# Patient Record
Sex: Male | Born: 1975 | Race: White | Hispanic: No | Smoking: Former smoker
Health system: Southern US, Community
[De-identification: ages and names within clinical notes are randomized; demographics above are authoritative.]

## PROBLEM LIST (undated history)

## (undated) DIAGNOSIS — T148XXA Other injury of unspecified body region, initial encounter: Secondary | ICD-10-CM

## (undated) DIAGNOSIS — F32A Depression, unspecified: Secondary | ICD-10-CM

## (undated) DIAGNOSIS — I420 Dilated cardiomyopathy: Secondary | ICD-10-CM

## (undated) DIAGNOSIS — I509 Heart failure, unspecified: Secondary | ICD-10-CM

## (undated) DIAGNOSIS — Z85820 Personal history of malignant melanoma of skin: Secondary | ICD-10-CM

## (undated) DIAGNOSIS — Z95810 Presence of automatic (implantable) cardiac defibrillator: Secondary | ICD-10-CM

## (undated) DIAGNOSIS — I209 Angina pectoris, unspecified: Secondary | ICD-10-CM

## (undated) DIAGNOSIS — F329 Major depressive disorder, single episode, unspecified: Secondary | ICD-10-CM

## (undated) DIAGNOSIS — F1011 Alcohol abuse, in remission: Secondary | ICD-10-CM

## (undated) DIAGNOSIS — Z8674 Personal history of sudden cardiac arrest: Secondary | ICD-10-CM

## (undated) DIAGNOSIS — S5291XA Unspecified fracture of right forearm, initial encounter for closed fracture: Secondary | ICD-10-CM

## (undated) HISTORY — PX: CARDIAC DEFIBRILLATOR PLACEMENT: SHX171

## (undated) HISTORY — PX: TRANSTHORACIC ECHOCARDIOGRAM: SHX275

## (undated) HISTORY — PX: CARDIAC CATHETERIZATION: SHX172

## (undated) HISTORY — PX: WISDOM TOOTH EXTRACTION: SHX21

## (undated) HISTORY — PX: MELANOMA EXCISION WITH SENTINEL LYMPH NODE BIOPSY: SHX5267

---

## 2008-01-27 DIAGNOSIS — Z85820 Personal history of malignant melanoma of skin: Secondary | ICD-10-CM

## 2008-01-27 HISTORY — DX: Personal history of malignant melanoma of skin: Z85.820

## 2012-07-07 ENCOUNTER — Emergency Department (HOSPITAL_COMMUNITY)
Admission: EM | Admit: 2012-07-07 | Discharge: 2012-07-07 | Disposition: A | Discharge status: Home / Self Care | Payer: Self-pay | Attending: Emergency Medicine | Admitting: Emergency Medicine

## 2012-07-07 ENCOUNTER — Encounter (HOSPITAL_COMMUNITY): Payer: Self-pay | Admitting: Emergency Medicine

## 2012-07-07 ENCOUNTER — Emergency Department (HOSPITAL_COMMUNITY): Payer: Self-pay

## 2012-07-07 DIAGNOSIS — Z85820 Personal history of malignant melanoma of skin: Secondary | ICD-10-CM | POA: Insufficient documentation

## 2012-07-07 DIAGNOSIS — IMO0002 Reserved for concepts with insufficient information to code with codable children: Secondary | ICD-10-CM | POA: Insufficient documentation

## 2012-07-07 DIAGNOSIS — X58XXXA Exposure to other specified factors, initial encounter: Secondary | ICD-10-CM | POA: Insufficient documentation

## 2012-07-07 DIAGNOSIS — S3022XA Contusion of scrotum and testes, initial encounter: Secondary | ICD-10-CM

## 2012-07-07 DIAGNOSIS — Y939 Activity, unspecified: Secondary | ICD-10-CM | POA: Insufficient documentation

## 2012-07-07 DIAGNOSIS — F17200 Nicotine dependence, unspecified, uncomplicated: Secondary | ICD-10-CM | POA: Insufficient documentation

## 2012-07-07 DIAGNOSIS — Y929 Unspecified place or not applicable: Secondary | ICD-10-CM | POA: Insufficient documentation

## 2012-07-07 NOTE — ED Notes (Signed)
Dr. Radford Pax and Dr. Durene Cal at bedside.
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
## 2012-07-07 NOTE — ED Notes (Signed)
Pt returned from radiology.

## 2012-07-07 NOTE — ED Notes (Signed)
MD at bedside. 

## 2012-07-08 NOTE — ED Provider Notes (Signed)
I saw and evaluated the patient, reviewed the resident's note and I agree with the findings and plan.   .Face to face Exam:  General:  Awake HEENT:  Atraumatic Resp:  Normal effort Abd:  Nondistended Neuro:No focal weakness   Ardine Iacovelli L Florice Hindle, MD 07/08/12 1048 

## 2013-11-22 ENCOUNTER — Encounter (HOSPITAL_COMMUNITY): Payer: Self-pay | Admitting: Emergency Medicine

## 2013-11-22 ENCOUNTER — Emergency Department (HOSPITAL_COMMUNITY): Payer: Self-pay

## 2013-11-22 ENCOUNTER — Emergency Department (HOSPITAL_COMMUNITY)
Admission: EM | Admit: 2013-11-22 | Discharge: 2013-11-22 | Disposition: A | Discharge status: Home / Self Care | Payer: Self-pay | Attending: Emergency Medicine | Admitting: Emergency Medicine

## 2013-11-22 DIAGNOSIS — J209 Acute bronchitis, unspecified: Secondary | ICD-10-CM | POA: Insufficient documentation

## 2013-11-22 DIAGNOSIS — Z720 Tobacco use: Secondary | ICD-10-CM | POA: Insufficient documentation

## 2013-11-22 DIAGNOSIS — Z85820 Personal history of malignant melanoma of skin: Secondary | ICD-10-CM | POA: Insufficient documentation

## 2013-11-22 DIAGNOSIS — Z79899 Other long term (current) drug therapy: Secondary | ICD-10-CM | POA: Insufficient documentation

## 2013-11-22 DIAGNOSIS — I447 Left bundle-branch block, unspecified: Secondary | ICD-10-CM | POA: Insufficient documentation

## 2013-11-22 DIAGNOSIS — R079 Chest pain, unspecified: Secondary | ICD-10-CM

## 2013-11-22 LAB — BASIC METABOLIC PANEL
Anion gap: 14 (ref 5–15)
BUN: 12 mg/dL (ref 6–23)
CALCIUM: 9.4 mg/dL (ref 8.4–10.5)
CO2: 25 meq/L (ref 19–32)
Chloride: 102 mEq/L (ref 96–112)
Creatinine, Ser: 0.91 mg/dL (ref 0.50–1.35)
GFR calc Af Amer: >90 mL/min (ref >90)
GFR calc non Af Amer: >90 mL/min (ref >90)
GLUCOSE: 90 mg/dL (ref 70–99)
Potassium: 4 mEq/L (ref 3.7–5.3)
SODIUM: 141 meq/L (ref 137–147)

## 2013-11-22 LAB — CBC WITH DIFFERENTIAL/PLATELET
Basophils Absolute: 0.1 10*3/uL (ref 0.0–0.1)
Basophils Relative: 1 % (ref 0–1)
Eosinophils Absolute: 0.2 10*3/uL (ref 0.0–0.7)
Eosinophils Relative: 2 % (ref 0–5)
HCT: 48 % (ref 39.0–52.0)
HEMOGLOBIN: 17.1 g/dL — AB (ref 13.0–17.0)
LYMPHS ABS: 2.8 10*3/uL (ref 0.7–4.0)
Lymphocytes Relative: 29 % (ref 12–46)
MCH: 33.8 pg (ref 26.0–34.0)
MCHC: 35.6 g/dL (ref 30.0–36.0)
MCV: 94.9 fL (ref 78.0–100.0)
MONOS PCT: 11 % (ref 3–12)
Monocytes Absolute: 1.1 10*3/uL — ABNORMAL HIGH (ref 0.1–1.0)
NEUTROS ABS: 5.6 10*3/uL (ref 1.7–7.7)
NEUTROS PCT: 57 % (ref 43–77)
Platelets: 282 10*3/uL (ref 150–400)
RBC: 5.06 MIL/uL (ref 4.22–5.81)
RDW: 12.3 % (ref 11.5–15.5)
WBC: 9.8 10*3/uL (ref 4.0–10.5)

## 2013-11-22 LAB — I-STAT TROPONIN, ED: TROPONIN I, POC: 0.01 ng/mL (ref 0.00–0.08)

## 2013-11-22 MED ORDER — IPRATROPIUM-ALBUTEROL 0.5-2.5 (3) MG/3ML IN SOLN: 3.0000 mL | RESPIRATORY_TRACT | Status: DC

## 2013-11-22 MED ORDER — ASPIRIN 325 MG PO TABS
325.0000 mg | ORAL_TABLET | Freq: Once | ORAL | Status: AC
  Administered 2013-11-22: 325 mg via ORAL
  Filled 2013-11-22: qty 1

## 2013-11-22 MED ORDER — HYDROCOD POLST-CHLORPHEN POLST 10-8 MG/5ML PO LQCR: 5.0000 mL | Freq: Two times a day (BID) | ORAL | Status: DC

## 2013-11-22 MED ORDER — IPRATROPIUM-ALBUTEROL 0.5-2.5 (3) MG/3ML IN SOLN
3.0000 mL | Freq: Once | RESPIRATORY_TRACT | Status: AC
  Administered 2013-11-22: 3 mL via RESPIRATORY_TRACT
  Filled 2013-11-22: qty 3

## 2013-11-22 MED ORDER — IPRATROPIUM-ALBUTEROL 0.5-2.5 (3) MG/3ML IN SOLN
3.0000 mL | RESPIRATORY_TRACT | Status: DC
  Filled 2013-11-22: qty 3

## 2013-11-22 MED ORDER — ALBUTEROL SULFATE HFA 108 (90 BASE) MCG/ACT IN AERS: 2.0000 | INHALATION_SPRAY | RESPIRATORY_TRACT | Status: DC | PRN

## 2013-11-22 NOTE — ED Notes (Signed)
Pt to xray at this time.

## 2013-11-22 NOTE — ED Provider Notes (Signed)
CSN: [id number]     Arrival date & time 11/22/13  1855 History   First MD Initiated Contact with Patient 11/22/13 2019     Chief Complaint  Patient presents with  . Chest Pain  . Cough     (Consider location/radiation/quality/duration/timing/severity/associated sxs/prior Treatment) HPI The patient reports she's had a cough for approximately a week. He reports it has gotten worse and the cold symptoms a initially had seemed to move down into his chest. He reports that he is coughing up sputum which is green in color. He reports over the past couple of days now he has started to get some tightness feeling in his chest. He reports this worsened by coughing episodes. He denies any prior history of exertional chest pain or exertional dyspnea. He has no history of syncopal episodes.  Family history: The patient reports his father is unknown. Mother has no cardiac history. He has a sister who also has no cardiac history. He has a grandfather who had to get bypass surgery at an older age.  Past Medical History  Diagnosis Date  . Tobacco abuse   . Alcohol abuse     several x per week 4-5 beers  . Melanoma     excised-has derm follow up   Past Surgical History  Procedure Laterality Date  . None     Family History  Problem Relation Age of Onset  . Melanoma      grandfather   History  Substance Use Topics  . Smoking status: Current Every Day Smoker  . Smokeless tobacco: Not on file  . Alcohol Use: Yes    Review of Systems 10 Systems reviewed and are negative for acute change except as noted in the HPI.    Allergies  Review of patient's allergies indicates no known allergies.  Home Medications   Prior to Admission medications   Medication Sig Start Date End Date Taking? Authorizing Provider  pseudoephedrine (SUDAFED) 30 MG tablet Take 30 mg by mouth every 4 (four) hours as needed for congestion.   Yes Historical Provider, MD  albuterol (PROVENTIL HFA;VENTOLIN HFA) 108 (90  BASE) MCG/ACT inhaler Inhale 2 puffs into the lungs every 4 (four) hours as needed for wheezing or shortness of breath. 11/22/13   Arby BarretteMarcy Deni Berti, MD  chlorpheniramine-HYDROcodone (TUSSIONEX PENNKINETIC ER) 10-8 MG/5ML LQCR Take 5 mLs by mouth 2 (two) times daily. 11/22/13   Arby BarretteMarcy Ariabella Brien, MD   BP 124/62  Pulse 79  Temp(Src) 98.5 F (36.9 C) (Oral)  Resp 17  SpO2 95% Physical Exam  Constitutional: He is oriented to person, place, and time. He appears well-developed and well-nourished.  HENT:  Head: Normocephalic and atraumatic.  Nose: Nose normal.  Mouth/Throat: Oropharynx is clear and moist.  Eyes: EOM are normal. Pupils are equal, round, and reactive to light.  Neck: Neck supple.  Cardiovascular: Normal rate, regular rhythm, normal heart sounds and intact distal pulses.   Pulmonary/Chest: Effort normal. No respiratory distress.  Patient has occasional expiratory wheeze. Dry cough with deep inspiration.  Abdominal: Soft. Bowel sounds are normal. He exhibits no distension. There is no tenderness.  Musculoskeletal: Normal range of motion. He exhibits no edema.  Neurological: He is alert and oriented to person, place, and time. He has normal strength. Coordination normal. GCS eye subscore is 4. GCS verbal subscore is 5. GCS motor subscore is 6.  Skin: Skin is warm, dry and intact.  Psychiatric: He has a normal mood and affect.    ED Course  Procedures (including  critical care time) Labs Review Labs Reviewed  CBC WITH DIFFERENTIAL - Abnormal; Notable for the following:    Hemoglobin 17.1 (*)    Monocytes Absolute 1.1 (*)    All other components within normal limits  BASIC METABOLIC PANEL  I-STAT TROPOININ, ED    Imaging Review Dg Chest 2 View  11/22/2013   CLINICAL DATA:  Midsternal chest pain and productive cough.  EXAM: CHEST  2 VIEW  COMPARISON:  None.  FINDINGS: Normal heart size and mediastinal contours. No acute infiltrate or edema. No effusion or pneumothorax. No acute  osseous findings.  IMPRESSION: No active cardiopulmonary disease.   Electronically Signed   By: Tiburcio PeaJonathan  Watts M.D.   On: 11/22/2013 20:15     EKG Interpretation   Date/Time:  Wednesday November 22 2013 19:01:24 EDT Ventricular Rate:  103 PR Interval:  166 QRS Duration: 154 QT Interval:  388 QTC Calculation: 508 R Axis:   15 Text Interpretation:  Sinus tachycardia Possible Left atrial enlargement  Left bundle branch block Abnormal ECG agree. Confirmed by Donnald GarrePfeiffer, MD,  Lebron ConnersMarcy [phone] on 11/22/2013 8:51:21 PM      MDM   Final diagnoses:  Acute bronchitis, unspecified organism  Left bundle branch block   The patient presents with a bronchitis/walking pneumonia type of presentation. Symptoms started with characteristic URI then moved down into his chest with coughing and sputum production. The patient has an abnormal EKG without prior comparison available. The history was not suggestive of cardiac ischemia or dysrhythmia. I did review the EKG and the history with Dr. Christen ButterSivek (cardiology). He reports that the EKG alone without a presentation of ischemia is not an indication for further cardiac workup at this time.    Arby BarretteMarcy Danzel Marszalek, MD 11/22/13 2303

## 2013-11-22 NOTE — Discharge Instructions (Signed)
Acute Bronchitis Bronchitis is inflammation of the airways that extend from the windpipe into the lungs (bronchi). The inflammation often causes mucus to develop. This leads to a cough, which is the most common symptom of bronchitis.  In acute bronchitis, the condition usually develops suddenly and goes away over time, usually in a couple weeks. Smoking, allergies, and asthma can make bronchitis worse. Repeated episodes of bronchitis may cause further lung problems.  CAUSES Acute bronchitis is most often caused by the same virus that causes a cold. The virus can spread from person to person (contagious) through coughing, sneezing, and touching contaminated objects. SIGNS AND SYMPTOMS   Cough.   Fever.   Coughing up mucus.   Body aches.   Chest congestion.   Chills.   Shortness of breath.   Sore throat.  DIAGNOSIS  Acute bronchitis is usually diagnosed through a physical exam. Your health care provider will also ask you questions about your medical history. Tests, such as chest X-rays, are sometimes done to rule out other conditions.  TREATMENT  Acute bronchitis usually goes away in a couple weeks. Oftentimes, no medical treatment is necessary. Medicines are sometimes given for relief of fever or cough. Antibiotic medicines are usually not needed but may be prescribed in certain situations. In some cases, an inhaler may be recommended to help reduce shortness of breath and control the cough. A cool mist vaporizer may also be used to help thin bronchial secretions and make it easier to clear the chest.  HOME CARE INSTRUCTIONS  Get plenty of rest.   Drink enough fluids to keep your urine clear or pale yellow (unless you have a medical condition that requires fluid restriction). Increasing fluids may help thin your respiratory secretions (sputum) and reduce chest congestion, and it will prevent dehydration.   Take medicines only as directed by your health care provider.  If  you were prescribed an antibiotic medicine, finish it all even if you start to feel better.  Avoid smoking and secondhand smoke. Exposure to cigarette smoke or irritating chemicals will make bronchitis worse. If you are a smoker, consider using nicotine gum or skin patches to help control withdrawal symptoms. Quitting smoking will help your lungs heal faster.   Reduce the chances of another bout of acute bronchitis by washing your hands frequently, avoiding people with cold symptoms, and trying not to touch your hands to your mouth, nose, or eyes.   Keep all follow-up visits as directed by your health care provider.  SEEK MEDICAL CARE IF: Your symptoms do not improve after 1 week of treatment.  SEEK IMMEDIATE MEDICAL CARE IF:  You develop an increased fever or chills.   You have chest pain.   You have severe shortness of breath.  You have bloody sputum.   You develop dehydration.  You faint or repeatedly feel like you are going to pass out.  You develop repeated vomiting.  You develop a severe headache. MAKE SURE YOU:   Understand these instructions.  Will watch your condition.  Will get help right away if you are not doing well or get worse. Document Released: 02/20/2004 Document Revised: 05/29/2013 Document Reviewed: 07/05/2012 [facility] Patient Information 2015 [place], [place]. This information is not intended to replace advice given to you by your health care provider. Make sure you discuss any questions you have with your health care provider.  YOU HAVE AN ABNORMAL EKG (HEART TRACING). YOU NEED TO SCHEDULE A RECHECK WITH A FAMILY DOCTOR FOR FURTHER REGULAR MEDICAL CARE. QUIT  SMOKING. IT INCREASES YOUR RISK OF SERIOUS HEART AND LUNG DISEASE.   Emergency Department Resource Guide 1) Find a Doctor and Pay Out of Pocket Although you won't have to find out who is covered by your insurance plan, it is a good idea to ask around and get recommendations. You will then need  to call the office and see if the doctor you have chosen will accept you as a new patient and what types of options they offer for patients who are self-pay. Some doctors offer discounts or will set up payment plans for their patients who do not have insurance, but you will need to ask so you aren't surprised when you get to your appointment.  2) Contact Your Local Health Department Not all health departments have doctors that can see patients for sick visits, but many do, so it is worth a call to see if yours does. If you don't know where your local health department is, you can check in your phone book. The CDC also has a tool to help you locate your state's health department, and many state websites also have listings of all of their local health departments.  3) Find a Walk-in Clinic If your illness is not likely to be very severe or complicated, you may want to try a walk in clinic. These are popping up all over the country in pharmacies, drugstores, and shopping centers. They're usually staffed by nurse practitioners or physician assistants that have been trained to treat common illnesses and complaints. They're usually fairly quick and inexpensive. However, if you have serious medical issues or chronic medical problems, these are probably not your best option.  No Primary Care Doctor: - Call Health Connect at  [phone] - they can help you locate a primary care doctor that  accepts your insurance, provides certain services, etc. - Physician Referral Service- [phone]  Chronic Pain Problems: Organization         Address  Phone   Notes  [facility]  [phone]) [phone] Patients need to be referred by their primary care doctor.   Medication Assistance: Organization         Address  Phone   Notes  [facility] [address] [place], [place] [address] [phone]) [phone] --Must be a resident of [facility] [place] -- Must have NO insurance  coverage whatsoever (no Medicaid/ Medicare, etc.) -- The pt. MUST have a primary care doctor that directs their care regularly and follows them in the community   MedAssist  [phone]   [employer]  [phone]) [phone]    Agencies that provide inexpensive medical care: Organization         Address  Phone   Notes  [facility]  [phone]) [phone]   [facility]    [phone]) [phone]   [facility] [address] [place], [place] [address] [phone]) [phone]   [facility] [address], [place] [phone]) [phone]   Planned Parenthood    [phone]) [phone]   [facility]    [phone]) [phone]   [facility] and [facility]  [address], [place] Phone:  [phone]) [phone], Fax:  [phone] Hours of Operation:  9 am - 6 pm, M-F.  Also accepts Medicaid/Medicare and self-pay.  [facility]  [address], [place] Phone: [phone]) [phone], Fax: [phone]) [phone]. Hours of Operation:  8:30 am -  5:30 pm, M-F.  Also accepts Medicaid and self-pay.  [facility] [place] [address], [place] Phone: [phone]   [facility] [address] Natasha Bence [place], [place] [phone], Ext. [phone] Mondays & Thursdays: 7-9 AM.  First 15 patients are seen on a first come, first serve basis.    Medicaid-accepting [facility] Providers:  Organization         Address  Phone   Notes  [facility] [address], [place] [phone] Also accepts self-pay patients.  [facility] [address] Laurell Josephs [place], [place]  [phone]   [facility] [address], [place] [phone]   [facility] [address] [place] [phone]   Renaye Rakers [address], [place]   [phone] Only accepts [place] patients after they have their  name applied to their card.   Self-Pay (no insurance) in [facility]:  Organization         Address  Phone   Notes  Sickle Cell Patients, [facility] [address] [place], [place] [phone]   [facility] [address] [place], [place] [phone]   [facility]  [address], [place] [phone]   [facility]/Dr. Osei-Bonsu  [address] [place] or [address], [place] [phone] Phone number for both [place] and [place] locations is the same.  [facility] [address] [place] [phone]   [facility] [address] [place] or [address] [phone]   [facility] [address], [place] [phone], phone; [phone], fax Sees patients 1st and 3rd Saturday of every month.  Must not qualify for public or private insurance (i.e. Medicaid, Medicare, [place] Health Choice, Veterans' Benefits)  Household income should be no more than 200% of the poverty level The clinic cannot treat you if you are pregnant or think you are pregnant  Sexually transmitted diseases are not treated at the clinic.    Dental Care: Organization         Address  Phone  Notes  [facility] [place] [facility] [address] [place], [place] [phone] Accepts children up to age 33 who are enrolled in [place] or [place] Health Choice; pregnant women with a Medicaid card; and children who have applied for Medicaid or [place] Health Choice, but were declined, whose parents can pay a reduced fee at time of service.  [facility] of [facility]  [address], [place] [phone] Accepts children up to age 65 who are enrolled in [place] or [place] Health Choice; pregnant women with a Medicaid card; and children who have applied for  Medicaid or [place] Health Choice, but were declined, whose parents can pay a reduced fee at time of service.  [facility]  [address] [place], [place] [phone] Patients are seen by appointment only. Walk-ins are not accepted. [facility] will see patients 62 years of age and older. Monday - Tuesday (8am-5pm) Most Wednesdays (8:30-5pm) $30 per visit, cash only  [facility] Adult Dental Access PROGRAM  [address], [facility] [phone] Patients are seen by appointment only. Walk-ins are  not accepted. [facility] will see patients 70 years of age and older. One Wednesday Evening (Monthly: Volunteer Based).  $30 per visit, cash only  [employer]  [phone] for adults; Children under age 19, call Graduate Pediatric Dentistry at [phone]. Children aged 29-14, please call [phone] to request a pediatric application.  Dental services are provided in all areas of dental care including fillings, crowns and bridges, complete and partial dentures, implants, gum treatment, root canals, and extractions. Preventive care is also provided. Treatment is provided to both adults and children. Patients are selected via a lottery and there is often a waiting list.   [facility] [address] [place]  [phone] [email or web address]   [facility] [address] [place], [place] [phone], Ext. [phone] Second and Fourth Thursday of each month, opens at 6:30 AM; Clinic ends at 9 AM.  Patients are seen on a first-come first-served basis, and a limited number are seen during each clinic.   [facility]  [address] Ether Griffins [place], [place] [phone]   Eligibility Requirements You must have lived in [place], [place], or [place] counties for at least the last three months.   You cannot be eligible for state or federal sponsored [place], including [employer], [place],  or [employer].   You generally cannot be eligible for healthcare insurance through your employer.    How to apply: Eligibility screenings are held every Tuesday and Wednesday afternoon from 1:00 pm until 4:00 pm. You do not need an appointment for the interview!  [facility] [address] [place], [place] [phone]   [facility] Health Department  [phone]   [facility] Health Department  [phone]   [facility] Health Department  [phone]    Behavioral Health Resources in the Community: Intensive Outpatient Programs Organization         Address  Phone  Notes  [facility] Services [address] [place], [place] [phone]   [facility] Outpatient [address] [place], [place] [phone]   [facility] [address], [place], [place]  [phone]   [facility] [address]  [place], [place] [phone] or [phone]   Substance Abuse Resources Organization         Address  Phone  Notes  [facility] Drug Services  [phone]   [facility]  [phone]   [facility] [place]  [phone]   Floydene Flock  [phone]   Residential & Outpatient Substance Abuse Program  [phone]   Psychological Services Organization         Address  Phone  Notes  [facility] Behavioral Health  [phone]   [facility] Services  [phone]   [facility] Mental Health [address] [place] [phone] or [phone]    Mobile Crisis Teams Organization         Address  Phone  Notes  [facility], Mobile Crisis Care Unit  [phone]   [facility]  [address] [place], [place] [phone]   Doristine Locks [address] [place] [phone]    Self-Help/Support Groups Organization         Address  Phone             Notes  [facility] [place] - variety of support groups   [phone]- I7437963 Call for more information  Narcotics Anonymous (NA),  [facility] [address], [place]  2 meetings at this location   Residential Sports administratorTreatment Programs Organization         Address  Phone  Notes  [facility] [address],    [place]  [phone]   [facility]  [address] [place] [phone], [place], [place] [phone]   [facility] [address], [place] [phone] Admissions: 8am-3pm M-F  [facility] [address]    [place], [place] [phone]   [facility] [address], [place], [place] [phone]   [facility] [address]  [place], [place] [phone]   [facility] - Intensive Outpatient [address], [place], [place] [phone]   [facility] ([facility] [address]  [place], [place] [phone] or [phone]   [facility] ([facility]) [address] [place], [place] [phone] Accepts Medicaid  [facility] [address]  [place] [phone] Substance Abuse/Addiction Treatment   [facility] Behavioral Health Resources Organization         Address  Phone  Notes  [facility]  [phone](888) [phone]   Angie FavaJulie Brannon, PhD [address] [place], [place]   [phone](336) [phone] or [phone]) [phone]   [facility]   [address] [place], [place] [phone](336) [phone]   [facility] [address], [place], [place] [phone]) [phone] Insurance/Medicaid/sponsorship through [facility]  [facility] [address]                                    [place], [place] [phone]) [phone] Therapy/tele-psych/case  [facility] [address].   [place], [place] [phone](336) [phone]    Dr. Lolly MustacheArfeen  [phone](336) [phone]   [facility]  United [employer] [facility] 1) [address], [place] 2) [address], [place] 3)  [address], [place] [phone](336) [phone](336) [phone]  [phone]) [phone]   [facility] Child Abuse Hotline [phone](336) [phone] or [phone](336) [phone] (After Hours)

## 2013-11-22 NOTE — ED Notes (Signed)
Pt c/o mid sternal CP with cough and pain with cough; pt sts feels tightness in chest; pt noted to have LBBB no hx of same; EDP aware

## 2015-07-29 IMAGING — CR DG CHEST 2V
2 series · 2 of 2 positions shown · non-contrast
Comparison: None.

CLINICAL DATA: Midsternal chest pain and productive cough.

EXAM:
CHEST  2 VIEW

[w chest pa]
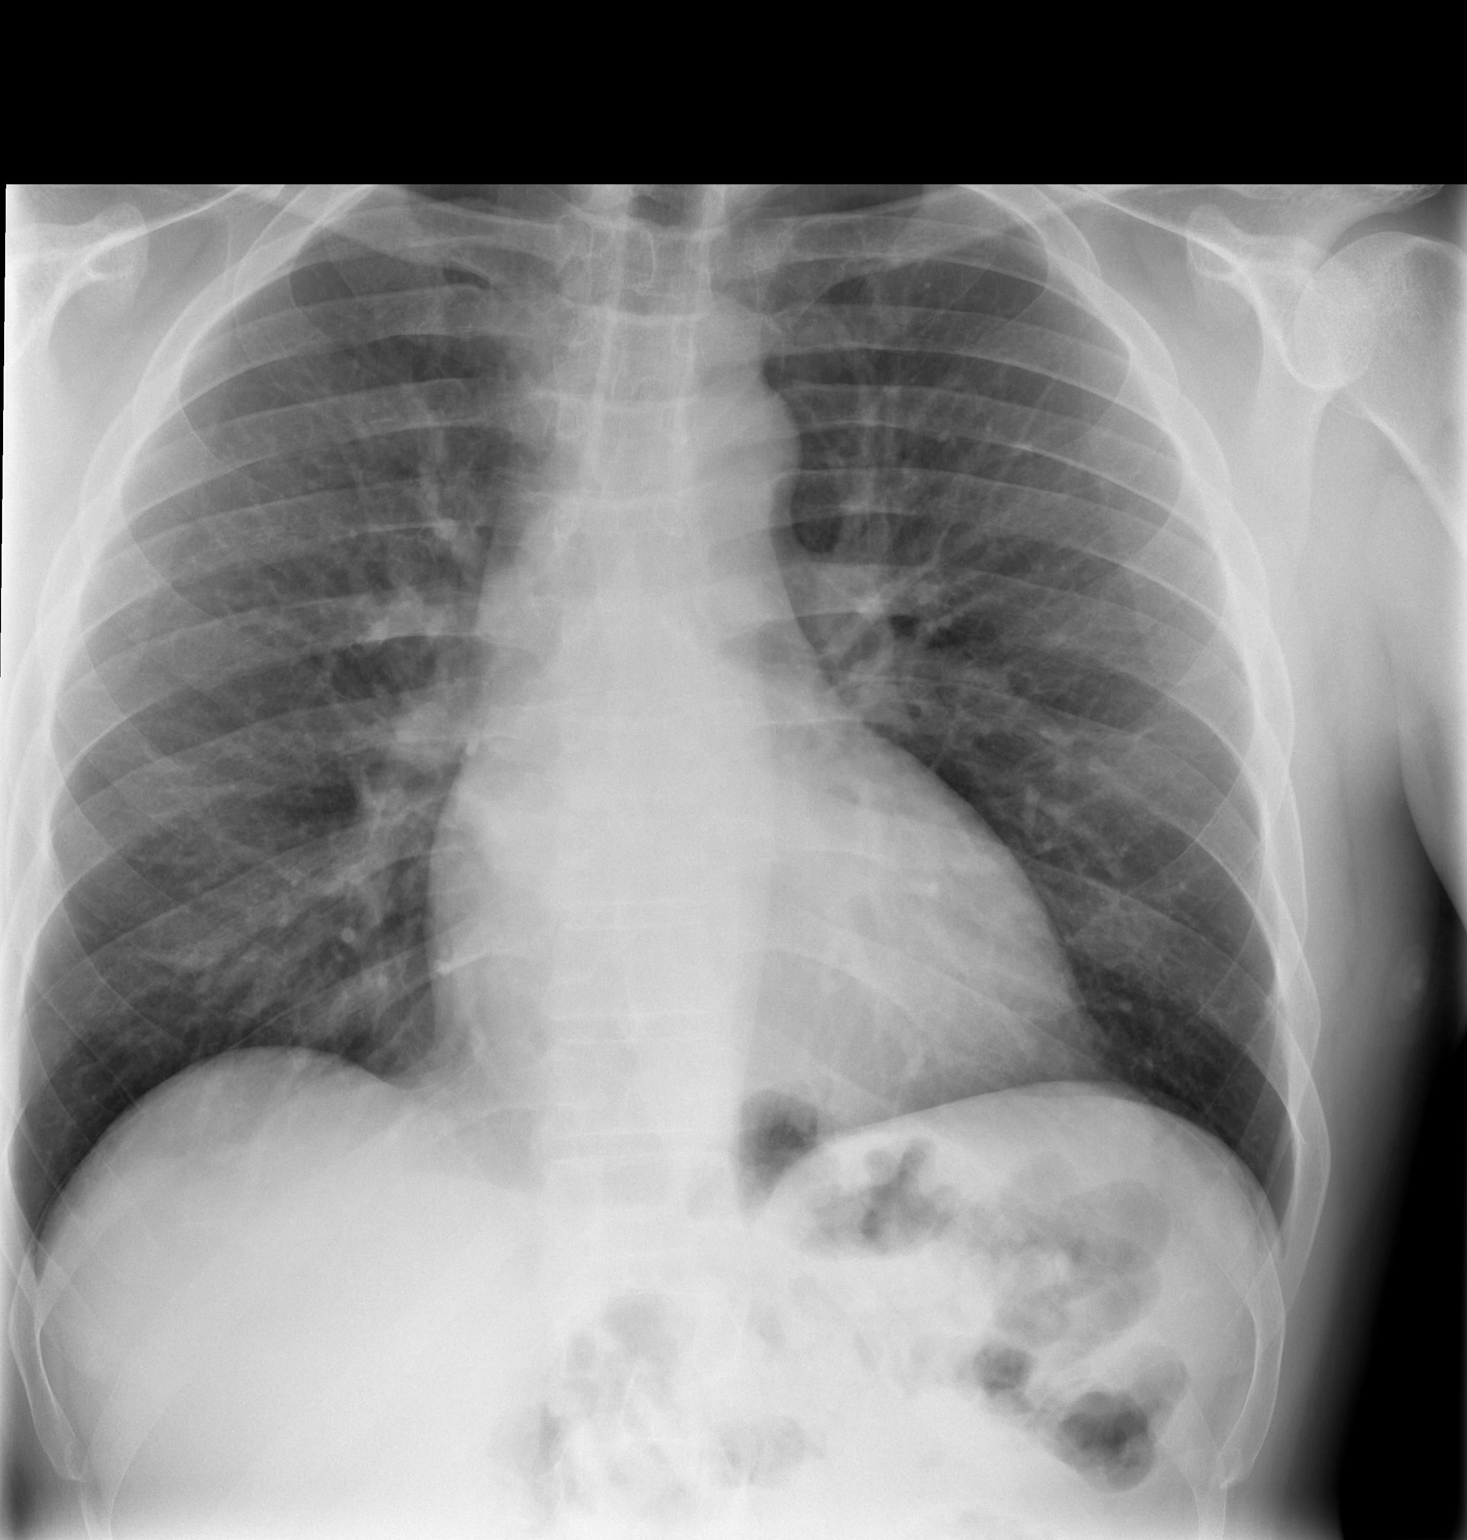

[w chest lat]
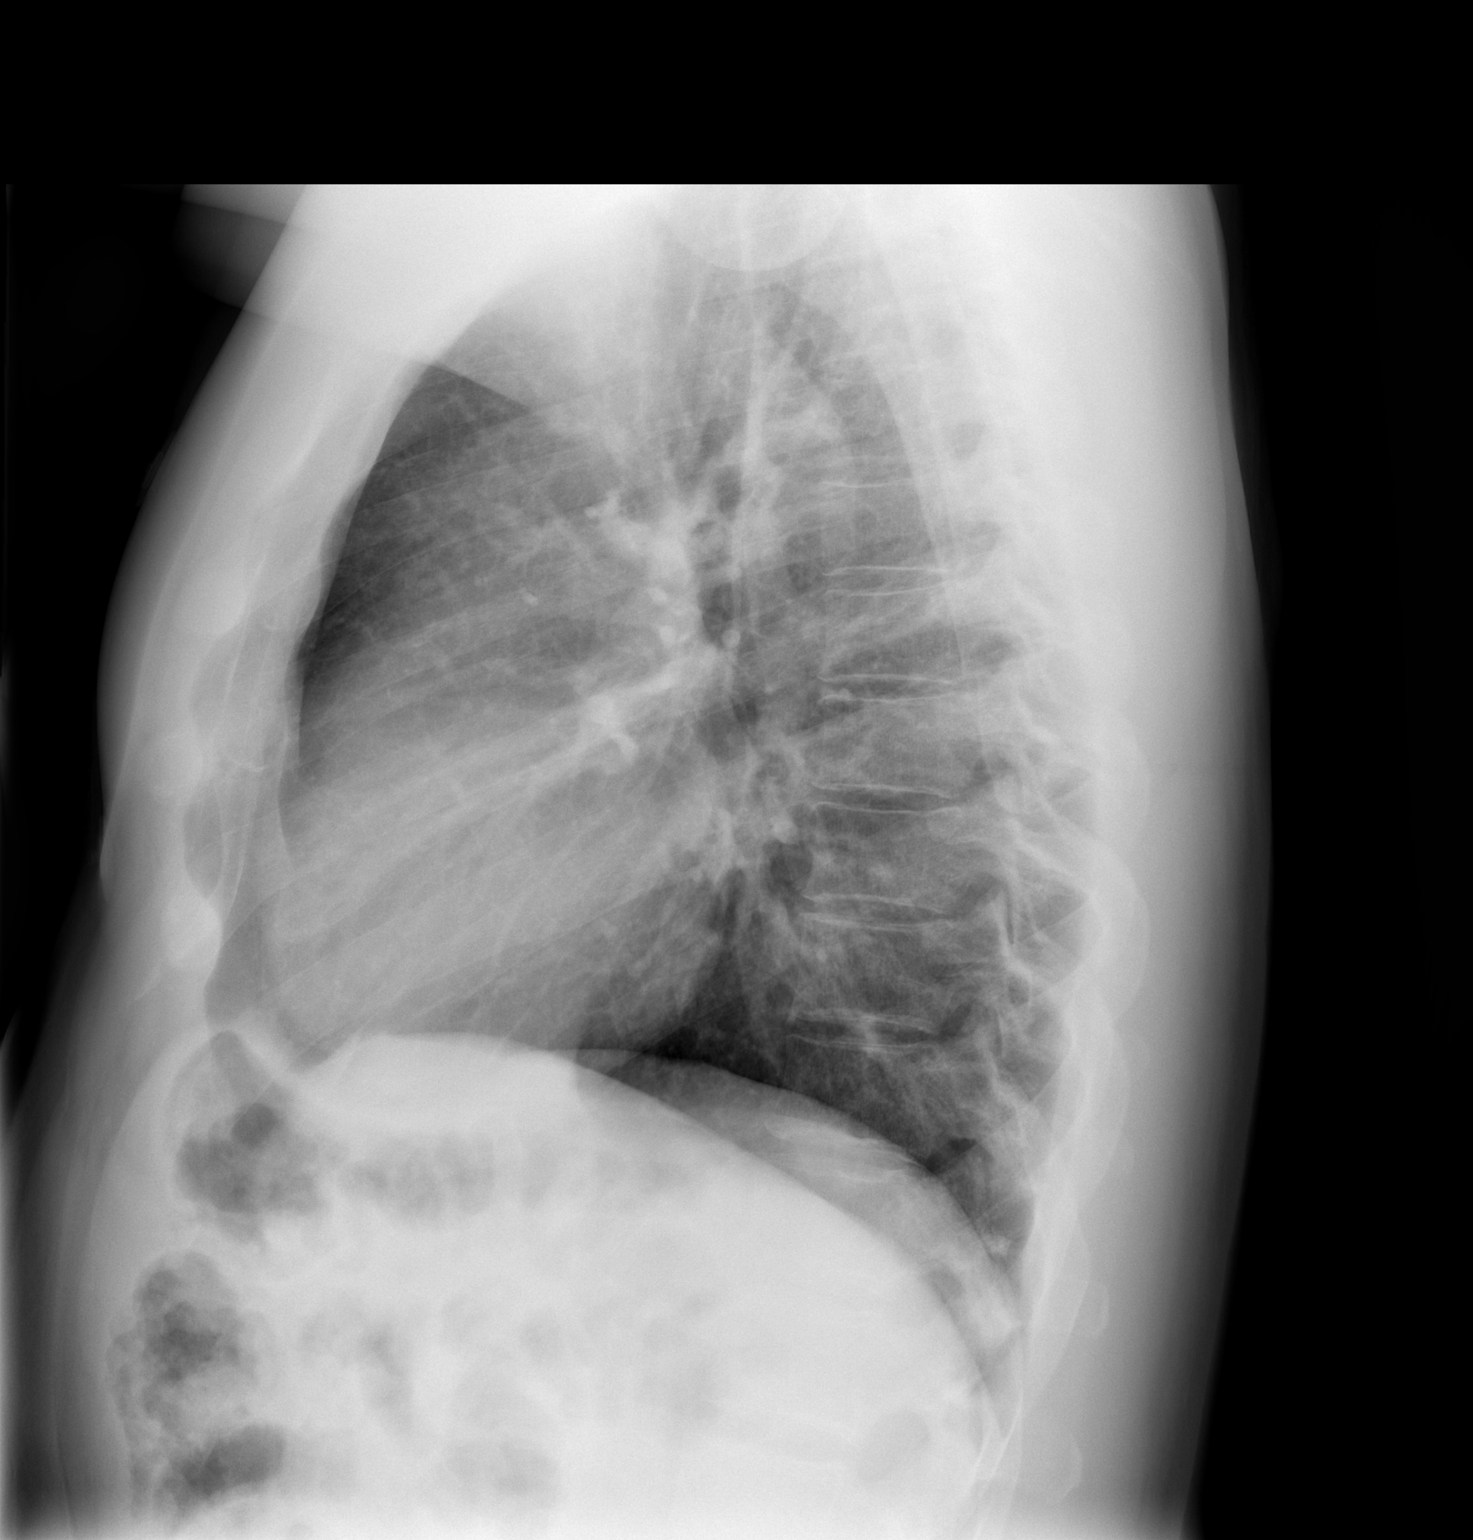

[2 of 2 positions shown; findings below may reference images not displayed]

FINDINGS: Normal heart size and mediastinal contours. No acute infiltrate or
edema. No effusion or pneumothorax. No acute osseous findings.
IMPRESSION: No active cardiopulmonary disease.

## 2015-11-07 DIAGNOSIS — I10 Essential (primary) hypertension: Secondary | ICD-10-CM

## 2015-11-07 DIAGNOSIS — R55 Syncope and collapse: Secondary | ICD-10-CM

## 2015-11-07 DIAGNOSIS — R42 Dizziness and giddiness: Secondary | ICD-10-CM

## 2015-11-07 DIAGNOSIS — R002 Palpitations: Secondary | ICD-10-CM

## 2015-11-07 DIAGNOSIS — F17210 Nicotine dependence, cigarettes, uncomplicated: Secondary | ICD-10-CM

## 2015-11-07 DIAGNOSIS — F1210 Cannabis abuse, uncomplicated: Secondary | ICD-10-CM

## 2015-11-07 DIAGNOSIS — Z899 Acquired absence of limb, unspecified: Secondary | ICD-10-CM

## 2015-11-07 DIAGNOSIS — I447 Left bundle-branch block, unspecified: Secondary | ICD-10-CM

## 2015-11-08 DIAGNOSIS — I10 Essential (primary) hypertension: Secondary | ICD-10-CM | POA: Diagnosis not present

## 2015-11-08 DIAGNOSIS — F1210 Cannabis abuse, uncomplicated: Secondary | ICD-10-CM | POA: Diagnosis not present

## 2015-11-08 DIAGNOSIS — R002 Palpitations: Secondary | ICD-10-CM | POA: Diagnosis not present

## 2015-11-08 DIAGNOSIS — Z899 Acquired absence of limb, unspecified: Secondary | ICD-10-CM | POA: Diagnosis not present

## 2015-11-08 DIAGNOSIS — I447 Left bundle-branch block, unspecified: Secondary | ICD-10-CM | POA: Diagnosis not present

## 2015-11-08 DIAGNOSIS — R55 Syncope and collapse: Secondary | ICD-10-CM | POA: Diagnosis not present

## 2015-11-08 DIAGNOSIS — R42 Dizziness and giddiness: Secondary | ICD-10-CM | POA: Diagnosis not present

## 2015-11-08 DIAGNOSIS — F17210 Nicotine dependence, cigarettes, uncomplicated: Secondary | ICD-10-CM | POA: Diagnosis not present

## 2015-11-09 DIAGNOSIS — R55 Syncope and collapse: Secondary | ICD-10-CM | POA: Diagnosis not present

## 2015-11-09 DIAGNOSIS — R002 Palpitations: Secondary | ICD-10-CM | POA: Diagnosis not present

## 2015-11-09 DIAGNOSIS — I10 Essential (primary) hypertension: Secondary | ICD-10-CM | POA: Diagnosis not present

## 2015-11-09 DIAGNOSIS — I447 Left bundle-branch block, unspecified: Secondary | ICD-10-CM | POA: Diagnosis not present

## 2015-11-10 DIAGNOSIS — I447 Left bundle-branch block, unspecified: Secondary | ICD-10-CM | POA: Diagnosis not present

## 2015-11-10 DIAGNOSIS — I10 Essential (primary) hypertension: Secondary | ICD-10-CM | POA: Diagnosis not present

## 2015-11-10 DIAGNOSIS — R55 Syncope and collapse: Secondary | ICD-10-CM | POA: Diagnosis not present

## 2015-11-10 DIAGNOSIS — R002 Palpitations: Secondary | ICD-10-CM | POA: Diagnosis not present

## 2015-11-11 DIAGNOSIS — I447 Left bundle-branch block, unspecified: Secondary | ICD-10-CM | POA: Diagnosis not present

## 2015-11-11 DIAGNOSIS — R002 Palpitations: Secondary | ICD-10-CM | POA: Diagnosis not present

## 2015-11-11 DIAGNOSIS — I10 Essential (primary) hypertension: Secondary | ICD-10-CM | POA: Diagnosis not present

## 2015-11-11 DIAGNOSIS — R55 Syncope and collapse: Secondary | ICD-10-CM | POA: Diagnosis not present

## 2015-12-27 DIAGNOSIS — Z8674 Personal history of sudden cardiac arrest: Secondary | ICD-10-CM

## 2015-12-27 HISTORY — DX: Personal history of sudden cardiac arrest: Z86.74

## 2017-08-26 ENCOUNTER — Encounter (HOSPITAL_BASED_OUTPATIENT_CLINIC_OR_DEPARTMENT_OTHER): Payer: Self-pay | Admitting: *Deleted

## 2017-08-26 ENCOUNTER — Other Ambulatory Visit: Payer: Self-pay | Admitting: Orthopedic Surgery

## 2017-08-26 ENCOUNTER — Other Ambulatory Visit: Payer: Self-pay

## 2017-08-26 NOTE — Progress Notes (Addendum)
Spoke w/ pt via phone for pre-op interview.   Npo after mn w/ exception clear liquids until 0615 (no cream/ milk products).   Arrive at 1015.  Needs istat.  Requested lov note, ekg, icd device check, and last 2 echos from pt cardiologist-,Dr Sandy Salaamali akbary ([facility] [place]), to received via fax.  Also requested cardiac cath reports , icd placement operative record, and discharge summary via fax to [facility] [place] [facility].  Per pt he know his ef was 18% when icd placed and per last echo in June 2019 ef 55%.  Denies cardiac s&s.    ADDENDUM:  Received via fax from dr Sandy Salaamali akbary office :  lov note dated 07-27-2017 which has device check results, echo from 07/ 2018 and 06-10-2017, and last ekg (which is current).  Updated history in epic. Placed fax information with chart.  Also faxed device orders to dr Sandy Salaamali akbary office.

## 2017-08-27 ENCOUNTER — Ambulatory Visit (HOSPITAL_BASED_OUTPATIENT_CLINIC_OR_DEPARTMENT_OTHER): Payer: Worker's Compensation | Admitting: Anesthesiology

## 2017-08-27 ENCOUNTER — Ambulatory Visit (HOSPITAL_BASED_OUTPATIENT_CLINIC_OR_DEPARTMENT_OTHER)
Admission: RE | Admit: 2017-08-27 | Discharge: 2017-08-27 | Disposition: A | Discharge status: Home / Self Care | Payer: Worker's Compensation | Source: Ambulatory Visit | Attending: Orthopedic Surgery | Admitting: Orthopedic Surgery

## 2017-08-27 ENCOUNTER — Encounter (HOSPITAL_BASED_OUTPATIENT_CLINIC_OR_DEPARTMENT_OTHER): Payer: Self-pay | Admitting: Anesthesiology

## 2017-08-27 ENCOUNTER — Encounter (HOSPITAL_BASED_OUTPATIENT_CLINIC_OR_DEPARTMENT_OTHER)
Admission: RE | Disposition: A | Discharge status: Home / Self Care | Payer: Self-pay | Source: Ambulatory Visit | Attending: Orthopedic Surgery

## 2017-08-27 DIAGNOSIS — W11XXXA Fall on and from ladder, initial encounter: Secondary | ICD-10-CM | POA: Diagnosis not present

## 2017-08-27 DIAGNOSIS — Z79899 Other long term (current) drug therapy: Secondary | ICD-10-CM | POA: Insufficient documentation

## 2017-08-27 DIAGNOSIS — Z95810 Presence of automatic (implantable) cardiac defibrillator: Secondary | ICD-10-CM | POA: Insufficient documentation

## 2017-08-27 DIAGNOSIS — I420 Dilated cardiomyopathy: Secondary | ICD-10-CM | POA: Diagnosis not present

## 2017-08-27 DIAGNOSIS — I509 Heart failure, unspecified: Secondary | ICD-10-CM | POA: Insufficient documentation

## 2017-08-27 DIAGNOSIS — Z8674 Personal history of sudden cardiac arrest: Secondary | ICD-10-CM | POA: Insufficient documentation

## 2017-08-27 DIAGNOSIS — Z85820 Personal history of malignant melanoma of skin: Secondary | ICD-10-CM | POA: Insufficient documentation

## 2017-08-27 DIAGNOSIS — I110 Hypertensive heart disease with heart failure: Secondary | ICD-10-CM | POA: Diagnosis not present

## 2017-08-27 DIAGNOSIS — Z7982 Long term (current) use of aspirin: Secondary | ICD-10-CM | POA: Diagnosis not present

## 2017-08-27 DIAGNOSIS — Z808 Family history of malignant neoplasm of other organs or systems: Secondary | ICD-10-CM | POA: Diagnosis not present

## 2017-08-27 DIAGNOSIS — Z87891 Personal history of nicotine dependence: Secondary | ICD-10-CM | POA: Diagnosis not present

## 2017-08-27 DIAGNOSIS — S52571A Other intraarticular fracture of lower end of right radius, initial encounter for closed fracture: Secondary | ICD-10-CM | POA: Diagnosis present

## 2017-08-27 HISTORY — DX: Presence of automatic (implantable) cardiac defibrillator: Z95.810

## 2017-08-27 HISTORY — DX: Major depressive disorder, single episode, unspecified: F32.9

## 2017-08-27 HISTORY — DX: Other injury of unspecified body region, initial encounter: T14.8XXA

## 2017-08-27 HISTORY — DX: Personal history of sudden cardiac arrest: Z86.74

## 2017-08-27 HISTORY — DX: Dilated cardiomyopathy: I42.0

## 2017-08-27 HISTORY — DX: Heart failure, unspecified: I50.9

## 2017-08-27 HISTORY — DX: Depression, unspecified: F32.A

## 2017-08-27 HISTORY — DX: Unspecified fracture of right forearm, initial encounter for closed fracture: S52.91XA

## 2017-08-27 HISTORY — PX: OPEN REDUCTION INTERNAL FIXATION (ORIF) DISTAL RADIAL FRACTURE: SHX5989

## 2017-08-27 HISTORY — DX: Angina pectoris, unspecified: I20.9

## 2017-08-27 HISTORY — DX: Personal history of malignant melanoma of skin: Z85.820

## 2017-08-27 HISTORY — DX: Alcohol abuse, in remission: F10.11

## 2017-08-27 LAB — POCT I-STAT 4, (NA,K, GLUC, HGB,HCT)
Glucose, Bld: 110 mg/dL — ABNORMAL HIGH (ref 70–99)
HCT: 37 % — ABNORMAL LOW (ref 39.0–52.0)
Hemoglobin: 12.6 g/dL — ABNORMAL LOW (ref 13.0–17.0)
Potassium: 4.1 mmol/L (ref 3.5–5.1)
Sodium: 140 mmol/L (ref 135–145)

## 2017-08-27 SURGERY — OPEN REDUCTION INTERNAL FIXATION (ORIF) DISTAL RADIUS FRACTURE
Anesthesia: General | Laterality: Right

## 2017-08-27 MED ORDER — OXYCODONE HCL 5 MG PO TABS
5.0000 mg | ORAL_TABLET | Freq: Once | ORAL | Status: DC | PRN
  Filled 2017-08-27: qty 1

## 2017-08-27 MED ORDER — OXYCODONE HCL 5 MG/5ML PO SOLN
5.0000 mg | Freq: Once | ORAL | Status: DC | PRN
  Filled 2017-08-27: qty 5

## 2017-08-27 MED ORDER — MIDAZOLAM HCL 2 MG/2ML IJ SOLN
INTRAMUSCULAR | Status: AC
  Filled 2017-08-27: qty 2

## 2017-08-27 MED ORDER — LIDOCAINE 2% (20 MG/ML) 5 ML SYRINGE
INTRAMUSCULAR | Status: DC | PRN
  Administered 2017-08-27: 100 mg via INTRAVENOUS

## 2017-08-27 MED ORDER — FENTANYL CITRATE (PF) 100 MCG/2ML IJ SOLN
INTRAMUSCULAR | Status: AC
  Filled 2017-08-27: qty 2

## 2017-08-27 MED ORDER — PROMETHAZINE HCL 25 MG/ML IJ SOLN
6.2500 mg | INTRAMUSCULAR | Status: DC | PRN
Start: 2017-08-27 — End: 2017-08-27
  Filled 2017-08-27: qty 1

## 2017-08-27 MED ORDER — CEFAZOLIN SODIUM-DEXTROSE 2-4 GM/100ML-% IV SOLN
2.0000 g | INTRAVENOUS | Status: AC
  Administered 2017-08-27: 2 g via INTRAVENOUS
  Filled 2017-08-27: qty 100

## 2017-08-27 MED ORDER — ONDANSETRON HCL 4 MG/2ML IJ SOLN
INTRAMUSCULAR | Status: AC
  Filled 2017-08-27: qty 2

## 2017-08-27 MED ORDER — MIDAZOLAM HCL 2 MG/2ML IJ SOLN
INTRAMUSCULAR | Status: DC | PRN
  Administered 2017-08-27: 1.5 mg via INTRAVENOUS

## 2017-08-27 MED ORDER — MIDAZOLAM HCL 2 MG/2ML IJ SOLN
2.0000 mg | Freq: Once | INTRAMUSCULAR | Status: AC
  Administered 2017-08-27: 2 mg via INTRAVENOUS
  Filled 2017-08-27: qty 2

## 2017-08-27 MED ORDER — BUPIVACAINE-EPINEPHRINE (PF) 0.5% -1:200000 IJ SOLN
INTRAMUSCULAR | Status: DC | PRN
  Administered 2017-08-27: 30 mL via PERINEURAL

## 2017-08-27 MED ORDER — FENTANYL CITRATE (PF) 100 MCG/2ML IJ SOLN
INTRAMUSCULAR | Status: DC | PRN
  Administered 2017-08-27: 25 ug via INTRAVENOUS
  Administered 2017-08-27: 50 ug via INTRAVENOUS

## 2017-08-27 MED ORDER — ONDANSETRON HCL 4 MG/2ML IJ SOLN
INTRAMUSCULAR | Status: DC | PRN
  Administered 2017-08-27: 4 mg via INTRAVENOUS

## 2017-08-27 MED ORDER — LACTATED RINGERS IV SOLN
INTRAVENOUS | Status: DC
  Administered 2017-08-27: 1000 mL via INTRAVENOUS
  Filled 2017-08-27: qty 1000

## 2017-08-27 MED ORDER — FENTANYL CITRATE (PF) 100 MCG/2ML IJ SOLN
25.0000 ug | INTRAMUSCULAR | Status: DC | PRN
  Filled 2017-08-27: qty 1

## 2017-08-27 MED ORDER — DEXAMETHASONE SODIUM PHOSPHATE 10 MG/ML IJ SOLN
INTRAMUSCULAR | Status: DC | PRN
  Administered 2017-08-27: 10 mg via INTRAVENOUS

## 2017-08-27 MED ORDER — PROPOFOL 10 MG/ML IV BOLUS
INTRAVENOUS | Status: DC | PRN
  Administered 2017-08-27: 200 mg via INTRAVENOUS

## 2017-08-27 MED ORDER — FENTANYL CITRATE (PF) 100 MCG/2ML IJ SOLN
100.0000 ug | Freq: Once | INTRAMUSCULAR | Status: AC
  Administered 2017-08-27: 100 ug via INTRAVENOUS
  Filled 2017-08-27: qty 2

## 2017-08-27 MED ORDER — CHLORHEXIDINE GLUCONATE 4 % EX LIQD
60.0000 mL | Freq: Once | CUTANEOUS | Status: DC
  Filled 2017-08-27: qty 118

## 2017-08-27 MED ORDER — PROPOFOL 10 MG/ML IV BOLUS
INTRAVENOUS | Status: AC
  Filled 2017-08-27: qty 40

## 2017-08-27 MED ORDER — MEPERIDINE HCL 25 MG/ML IJ SOLN
6.2500 mg | INTRAMUSCULAR | Status: DC | PRN
  Filled 2017-08-27: qty 1

## 2017-08-27 MED ORDER — CEFAZOLIN SODIUM-DEXTROSE 2-4 GM/100ML-% IV SOLN
INTRAVENOUS | Status: AC
  Filled 2017-08-27: qty 100

## 2017-08-27 MED ORDER — DEXAMETHASONE SODIUM PHOSPHATE 10 MG/ML IJ SOLN
INTRAMUSCULAR | Status: AC
  Filled 2017-08-27: qty 1

## 2017-08-27 MED ORDER — LIDOCAINE 2% (20 MG/ML) 5 ML SYRINGE
INTRAMUSCULAR | Status: AC
  Filled 2017-08-27: qty 5

## 2017-08-27 MED ORDER — LACTATED RINGERS IV SOLN
INTRAVENOUS | Status: DC
  Administered 2017-08-27: 11:00:00 via INTRAVENOUS
  Filled 2017-08-27: qty 1000

## 2017-08-27 SURGICAL SUPPLY — 70 items
APL SKNCLS STERI-STRIP NONHPOA (GAUZE/BANDAGES/DRESSINGS) ×1
BANDAGE ACE 3X5.8 VEL STRL LF (GAUZE/BANDAGES/DRESSINGS) ×3 IMPLANT
BANDAGE ACE 4X5 VEL STRL LF (GAUZE/BANDAGES/DRESSINGS) ×3 IMPLANT
BENZOIN TINCTURE PRP APPL 2/3 (GAUZE/BANDAGES/DRESSINGS) ×3 IMPLANT
BIT DRILL 2 FAST STEP (BIT) ×3 IMPLANT
BIT DRILL 2.5X4 QC (BIT) ×3 IMPLANT
BLADE MINI RND TIP GREEN BEAV (BLADE) IMPLANT
BLADE SURG 15 STRL LF DISP TIS (BLADE) ×2 IMPLANT
BLADE SURG 15 STRL SS (BLADE) ×6
BNDG CMPR 9X4 STRL LF SNTH (GAUZE/BANDAGES/DRESSINGS) ×1
BNDG ESMARK 4X9 LF (GAUZE/BANDAGES/DRESSINGS) ×3 IMPLANT
BNDG GAUZE ELAST 4 BULKY (GAUZE/BANDAGES/DRESSINGS) ×3 IMPLANT
CANISTER SUCT 1200ML W/VALVE (MISCELLANEOUS) IMPLANT
CLOSURE WOUND 1/2 X4 (GAUZE/BANDAGES/DRESSINGS) ×1
CORD BIPOLAR FORCEPS 12FT (ELECTRODE) ×3 IMPLANT
COVER BACK TABLE 60X90IN (DRAPES) ×3 IMPLANT
CUFF TOURNIQUET SINGLE 18IN (TOURNIQUET CUFF) ×3 IMPLANT
DECANTER SPIKE VIAL GLASS SM (MISCELLANEOUS) IMPLANT
DRAPE EXTREMITY T 121X128X90 (DRAPE) ×3 IMPLANT
DRAPE OEC MINIVIEW 54X84 (DRAPES) ×3 IMPLANT
DRAPE SHEET LG 3/4 BI-LAMINATE (DRAPES) ×6 IMPLANT
DRAPE SURG 17X23 STRL (DRAPES) ×3 IMPLANT
DURAPREP 26ML APPLICATOR (WOUND CARE) ×3 IMPLANT
ELECT REM PT RETURN 9FT ADLT (ELECTROSURGICAL)
ELECTRODE REM PT RTRN 9FT ADLT (ELECTROSURGICAL) IMPLANT
GAUZE SPONGE 4X4 12PLY STRL (GAUZE/BANDAGES/DRESSINGS) ×3 IMPLANT
GAUZE SPONGE 4X4 16PLY XRAY LF (GAUZE/BANDAGES/DRESSINGS) IMPLANT
GAUZE XEROFORM 1X8 LF (GAUZE/BANDAGES/DRESSINGS) ×3 IMPLANT
GLOVE SURG SYN 8.0 (GLOVE) ×3 IMPLANT
GOWN STRL REUS W/TWL LRG LVL3 (GOWN DISPOSABLE) ×3 IMPLANT
K-WIRE THREAD TROCAR TIP .045 (WIRE) ×3
KWIRE THREAD TROCAR TIP .045 (WIRE) ×1 IMPLANT
NEEDLE HYPO 25X1 1.5 SAFETY (NEEDLE) IMPLANT
NS IRRIG 1000ML POUR BTL (IV SOLUTION) IMPLANT
PACK BASIN DAY SURGERY FS (CUSTOM PROCEDURE TRAY) ×3 IMPLANT
PAD CAST 3X4 CTTN HI CHSV (CAST SUPPLIES) ×1 IMPLANT
PAD CAST 4YDX4 CTTN HI CHSV (CAST SUPPLIES) ×1 IMPLANT
PADDING CAST COTTON 3X4 STRL (CAST SUPPLIES) ×3
PADDING CAST COTTON 4X4 STRL (CAST SUPPLIES) ×3
PEG THREADED 2.5MMX22MM LONG (Peg) ×3 IMPLANT
PEG THREADED 2.5MMX24MM LONG (Peg) ×3 IMPLANT
PEG THREADED 2.5MMX26MM LONG (Peg) ×12 IMPLANT
PEG THREADED 2.5MMX28MM LONG (Peg) ×9 IMPLANT
PENCIL BUTTON HOLSTER BLD 10FT (ELECTRODE) IMPLANT
PLATE WIDE 28.2X62.6 RT (Plate) ×3 IMPLANT
SCREW CORT 3.5X16 LNG (Screw) ×6 IMPLANT
SCREW CORT 3.5X18 LNG (Screw) ×3 IMPLANT
SLEEVE SCD COMPRESS KNEE MED (MISCELLANEOUS) ×3 IMPLANT
SPLINT PLASTER CAST XFAST 4X15 (CAST SUPPLIES) ×15 IMPLANT
SPLINT PLASTER XTRA FAST SET 4 (CAST SUPPLIES) ×30
STOCKINETTE 4X48 STRL (DRAPES) ×3 IMPLANT
STRIP CLOSURE SKIN 1/2X4 (GAUZE/BANDAGES/DRESSINGS) ×2 IMPLANT
SUCTION FRAZIER HANDLE 10FR (MISCELLANEOUS)
SUCTION TUBE FRAZIER 10FR DISP (MISCELLANEOUS) IMPLANT
SUT CHROMIC 3 0 PS 2 (SUTURE) IMPLANT
SUT ETHILON 4 0 PS 2 18 (SUTURE) IMPLANT
SUT MERSILENE 4 0 P 3 (SUTURE) IMPLANT
SUT PROLENE 3 0 PS 2 (SUTURE) ×3 IMPLANT
SUT VIC AB 0 SH 27 (SUTURE) IMPLANT
SUT VIC AB 2-0 SH 27 (SUTURE) ×3
SUT VIC AB 2-0 SH 27XBRD (SUTURE) ×1 IMPLANT
SUT VIC AB 3-0 FS2 27 (SUTURE) IMPLANT
SUT VIC AB 4-0 RB1 18 (SUTURE) IMPLANT
SUT VICRYL RAPIDE 4-0 (SUTURE) IMPLANT
SUT VICRYL RAPIDE 4/0 PS 2 (SUTURE) IMPLANT
SYR 10ML LL (SYRINGE) IMPLANT
SYR BULB 3OZ (MISCELLANEOUS) ×3 IMPLANT
TUBE CONNECTING 12'X1/4 (SUCTIONS)
TUBE CONNECTING 12X1/4 (SUCTIONS) IMPLANT
UNDERPAD 30X30 (UNDERPADS AND DIAPERS) ×3 IMPLANT

## 2017-08-27 NOTE — Discharge Instructions (Signed)
Regional Anesthesia Blocks ? ?1. Numbness or the inability to move the "blocked" extremity may last from 3-48 hours after placement. The length of time depends on the medication injected and your individual response to the medication. If the numbness is not going away after 48 hours, call your surgeon. ? ?2. The extremity that is blocked will need to be protected until the numbness is gone and the  Strength has returned. Because you cannot feel it, you will need to take extra care to avoid injury. Because it may be weak, you may have difficulty moving it or using it. You may not know what position it is in without looking at it while the block is in effect. ? ?3. For blocks in the legs and feet, returning to weight bearing and walking needs to be done carefully. You will need to wait until the numbness is entirely gone and the strength has returned. You should be able to move your leg and foot normally before you try and bear weight or walk. You will need someone to be with you when you first try to ensure you do not fall and possibly risk injury. ? ?4. Bruising and tenderness at the needle site are common side effects and will resolve in a few days. ? ?5. Persistent numbness or new problems with movement should be communicated to the surgeon or the [facility] ([phone])/ [facility] ([phone]).  ? ?Post Anesthesia Home Care Instructions ? ?Activity: ?Get plenty of rest for the remainder of the day. A responsible individual must stay with you for 24 hours following the procedure.  ?For the next 24 hours, DO NOT: ?-Drive a car ?-Operate machinery ?-Drink alcoholic beverages ?-Take any medication unless instructed by your physician ?-Make any legal decisions or sign important papers. ? ?Meals: ?Start with liquid foods such as gelatin or soup. Progress to regular foods as tolerated. Avoid greasy, spicy, heavy foods. If nausea and/or vomiting occur, drink only clear liquids until the  nausea and/or vomiting subsides. Call your physician if vomiting continues. ? ?Special Instructions/Symptoms: ?Your throat may feel dry or sore from the anesthesia or the breathing tube placed in your throat during surgery. If this causes discomfort, gargle with warm salt water. The discomfort should disappear within 24 hours. ? ?If you had a scopolamine patch placed behind your ear for the management of post- operative nausea and/or vomiting: ? ?1. The medication in the patch is effective for 72 hours, after which it should be removed.  Wrap patch in a tissue and discard in the trash. Wash hands thoroughly with soap and water. ?2. You may remove the patch earlier than 72 hours if you experience unpleasant side effects which may include dry mouth, dizziness or visual disturbances. ?3. Avoid touching the patch. Wash your hands with soap and water after contact with the patch. ?    ?

## 2017-08-27 NOTE — H&P (Signed)
Keith Campbell is an 42 y.o. male.   Chief Complaint: right wrist pain, swelling, and deformity HPI: patient's very pleasant 42 year old male right-hand dominant status post fall off a ladder approximately 48 hours ago with displaced intra-articular fracture dominant right distal radius  Past Medical History:  Diagnosis Date  . Abrasion    right shin from fall  . AICD (automatic cardioverter/defibrillator) present   . Anginal pain (HCC)   . Cardiomyopathy, dilated, nonischemic (HCC)    12/ 2017  per cardiac cath ef 20%;  per last TTE echo 06-10-2017  ef 50-55% recovered  . CHF (congestive heart failure) (HCC)   . Depression    in the past  . History of alcohol abuse    per pt quit 12/ 2017  . 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 History of malignant melanoma 2010   on back , localized -- s/p  WLE on back and axilla lymph node bx's (negative)  . ICD (implantable cardioverter-defibrillator), dual, in situ cardiologist-  dr Sandy Salaamali akbary---  last device check 07-27-2017  per office note interrogation of dual chamber medtronic defibrillator showed all values normal range and no episodes of arrhythmia   12/ 2017-- cardiac arrest w/ VT and VF  . Right radial fracture     Past Surgical History:  Procedure Laterality Date  . CARDIAC CATHETERIZATION  12/ 2017    [facility]   normal coronaries, ef 20%  . CARDIAC DEFIBRILLATOR PLACEMENT  12/ 2017   @[facility]   dual chamber metronic  . MELANOMA EXCISION WITH SENTINEL LYMPH NODE BIOPSY  2010      @[facility]   WLE on back right axilla sln bx's  . TRANSTHORACIC ECHOCARDIOGRAM  06-10-2017   dr Karie Mainlandali Rudolpho Sevinakbary   ef 50-55% (improve from previous echo 07-2016 ef 30-35%)/  mild AR with mild sclerosis , no stenosis/  mild LAE/  mild MR and TR/  trace PR/  RVSP 6649mmHg/  pacer and/ or defibrillator wires visualized in right ventricle  . WISDOM TOOTH EXTRACTION  age 42    Family  History  Problem Relation Age of Onset  . Melanoma Unknown        grandfather   Social History:  reports that he quit smoking about 20 months ago. His smoking use included cigarettes. He has a 44.00 pack-year smoking history. He has never used smokeless tobacco. He reports that he drank alcohol. He reports that he has current or past drug history. Drug: Marijuana.  Allergies: No Known Allergies  Medications Prior to Admission  Medication Sig Dispense Refill  . arginine 500 MG tablet Take 1,000 mg by mouth 2 (two) times daily.    Marland Kitchen. aspirin EC 81 MG tablet Take 81 mg by mouth daily.    . carvedilol (COREG) 25 MG tablet Take 37.5 mg by mouth 2 (two) times daily with a meal. Pt takes 1 and 1/2 tab to = 37.5mg     . Omega-3 Fatty Acids (FISH OIL PO) Take 1 capsule by mouth daily.    . sacubitril-valsartan (ENTRESTO) 24-26 MG Take 1 tablet by mouth 2 (two) times daily.    Marland Kitchen. spironolactone (ALDACTONE) 25 MG tablet Take 12.5 mg by mouth every other day.      Results for orders placed or performed during the hospital encounter of 08/27/17 (from the past 48 hour(s))  I-STAT 4, (NA,K, GLUC, HGB,HCT)     Status:  Abnormal   Collection Time: 08/27/17 10:57 AM  Result Value Ref Range   Sodium 140 135 - 145 mmol/L   Potassium 4.1 3.5 - 5.1 mmol/L   Glucose, Bld 110 (H) 70 - 99 mg/dL   HCT 78.2 (L) 95.6 - 21.3 %   Hemoglobin 12.6 (L) 13.0 - 17.0 g/dL   No results found.  Review of Systems  All other systems reviewed and are negative.   Blood pressure 130/74, pulse 87, temperature 97.8 F (36.6 C), temperature source Oral, resp. rate 16, height 6\' 1"  (1.854 m), weight 98.4 kg (217 lb), SpO2 98 %. Physical Exam  Constitutional: He appears well-developed and well-nourished.  HENT:  Head: Normocephalic and atraumatic.  Neck: Normal range of motion.  Cardiovascular: Normal rate.  Respiratory: Effort normal.  Musculoskeletal:       Right wrist: He exhibits tenderness, bony tenderness, swelling  and deformity.  rightt wrist pain, swelling, and deformity status post fall  Skin: Skin is warm.  Psychiatric: He has a normal mood and affect. His behavior is normal. Judgment and thought content normal.     Assessment/Plan 42 year old right-hand-dominant male with displaced intra-articular fracture distal radius dominant right side. I discussed with the patient and his family the need for operative fixation of this fracture as an outpatient.they understand the risks and benefits and wished to proceed.  Marlowe Shores, MD 08/27/2017, 11:55 AM

## 2017-08-27 NOTE — Op Note (Signed)
Please see dictated report [id number]

## 2017-08-27 NOTE — Anesthesia Procedure Notes (Signed)
Anesthesia Regional Block: Supraclavicular block   Pre-Anesthetic Checklist: ,, timeout performed, Correct Patient, Correct Site, Correct Laterality, Correct Procedure, Correct Position, site marked, Risks and benefits discussed,  Surgical consent,  Pre-op evaluation,  At surgeon's request and post-op pain management  Laterality: Right  Prep: chloraprep       Needles:  Injection technique: Single-shot  Needle Type: Echogenic Needle     Needle Length: 9cm  Needle Gauge: 21     Additional Needles:   Procedures:,,,, ultrasound used (permanent image in chart),,,,  Narrative:  Start time: 08/27/2017 11:55 AM End time: 08/27/2017 12:05 PM Injection made incrementally with aspirations every 5 mL.  Performed by: Personally  Anesthesiologist: Shelton SilvasHollis, Randell Teare D, MD  Additional Notes: Patient tolerated the procedure well. Local anesthetic introduced in an incremental fashion under minimal resistance after negative aspirations. No paresthesias were elicited. After completion of the procedure, no acute issues were identified and patient continued to be monitored by RN.

## 2017-08-27 NOTE — Op Note (Signed)
NAMColvin Caroli: Hagmann, Dwight MEDICAL RECORD [id number] ACCOUNT [id number] DATE OF BIRTH:08/26/1975 FACILITY: [facility] LOCATION: [facility] PHYSICIAN:Kya Mayfield A. Mina MarbleWEINGOLD, MD  OPERATIVE REPORT  DATE OF PROCEDURE:  08/27/2017  PREOPERATIVE DIAGNOSIS:  Displaced intraarticular fracture, right distal radius.  POSTOPERATIVE DIAGNOSIS:  Displaced intraarticular fracture, right distal radius.  PROCEDURE:  Open reduction internal fixation displaced intraarticular fracture 4-part distal radius, right side.  SURGEON:  Dairl PonderMatthew Usha Slager, MD  ASSISTANT:  None.  ANESTHESIA:  Supraclavicular block and general.  COMPLICATIONS:  None.  DRAINS:  None.   DESCRIPTION OF PROCEDURE:  The patient was taken to the operating suite after the induction of adequate regional and general anesthetic, the right upper extremity was prepped and draped in the usual sterile fashion.  An Esmarch was used to exsanguinate  the limb and the tourniquet was inflated to 250 mmHg.  At this point in time, incision was made over the palmar aspect of the forearm and wrist on the right side.  Skin was incised sharply.  Dissection was carried down to the interval between the radial  artery and the flexor carpi radialis tendon.  The fascia under the FCR was incised and dissection was carried down to the level of the pronator quadratus.  The pronator quadratus was subperiosteally stripped off the distal radius revealing a 4-part  intraarticular fracture of the distal radius on the right side.  We released the brachioradialis tendon off the radial styloid fragment to help with reduction.  Reduction was performed with longitudinal traction, flexion, and ulnar deviation.  Once this  was done, a wide right DVR plate was fastened to the volar aspect of the distal radius through the slotted hole.  Fluoroscopic imaging was used to determine adequate plate position.  The plate was then secured with 2 more cortical screws proximally  followed by the  partially threaded pegs distally as per protocol.  Fluoroscopic imaging then revealed adequate reduction in multiple views.  The wound was irrigated and loosely closed in layers of 2-0 undyed Vicryl to cover the plate, 2-0 undyed Vicryl  subcutaneously, and a 3-0 Prolene subcuticular stitch on the skin.  Steri-Strips, 4 x 4, and a compressive dressing and palmar splint was applied.  The patient tolerated this procedure well and went to recovery room in stable fashion.  TN/NUANCE  D:08/27/2017 T:08/27/2017 JOB:[id number]

## 2017-08-27 NOTE — Anesthesia Preprocedure Evaluation (Addendum)
Anesthesia Evaluation  Patient identified by MRN, date of birth, ID band Patient awake    Reviewed: Allergy & Precautions, NPO status , Patient's Chart, lab work & pertinent test results  Airway Mallampati: I  TM Distance: >3 FB Neck ROM: Full    Dental  (+) Loose, Dental Advisory Given,    Pulmonary former smoker,    breath sounds clear to auscultation       Cardiovascular hypertension, Pt. on medications and Pt. on home beta blockers  Rhythm:Regular Rate:Normal     Neuro/Psych negative neurological ROS     GI/Hepatic negative GI ROS, Neg liver ROS,   Endo/Other    Renal/GU negative Renal ROS     Musculoskeletal negative musculoskeletal ROS (+)   Abdominal Normal abdominal exam  (+)   Peds  Hematology   Anesthesia Other Findings   Reproductive/Obstetrics                            Anesthesia Physical Anesthesia Plan  ASA: III  Anesthesia Plan: General   Post-op Pain Management: GA combined w/ Regional for post-op pain   Induction: Intravenous  PONV Risk Score and Plan: 3 and Ondansetron, Dexamethasone and Midazolam  Airway Management Planned: LMA  Additional Equipment: None  Intra-op Plan:   Post-operative Plan: Extubation in OR  Informed Consent: I have reviewed the patients History and Physical, chart, labs and discussed the procedure including the risks, benefits and alternatives for the proposed anesthesia with the patient or authorized representative who has indicated his/her understanding and acceptance.   Dental advisory given  Plan Discussed with: CRNA  Anesthesia Plan Comments: (Magnet to be applied. Medtronic interrogation in PACU. RN aware. )       Anesthesia Quick Evaluation

## 2017-08-27 NOTE — Progress Notes (Signed)
1157 time out for block. Versed 2 mg and Fentanyl 100 mics IV given at 1158 for sedation. 1204 block completed tolerated well. VSS

## 2017-08-27 NOTE — Anesthesia Postprocedure Evaluation (Signed)
Anesthesia Post Note  Patient: Keith CaroliDarren Campbell  Procedure(s) Performed: OPEN REDUCTION INTERNAL FIXATION (ORIF) RIGHT DISTAL RADIAL FRACTURE (Right )     Patient location during evaluation: PACU Anesthesia Type: General Level of consciousness: awake and alert and oriented Pain management: pain level controlled Vital Signs Assessment: post-procedure vital signs reviewed and stable Respiratory status: spontaneous breathing, nonlabored ventilation and respiratory function stable Cardiovascular status: blood pressure returned to baseline and stable Postop Assessment: no apparent nausea or vomiting Anesthetic complications: no    Last Vitals:  Vitals:   08/27/17 1430 08/27/17 1445  BP: 137/83 135/72  Pulse: 87 88  Resp: 16 17  Temp:    SpO2: 95% 96%    Last Pain:  Vitals:   08/27/17 1430  TempSrc:   PainSc: 0-No pain                 Iveliz Garay A.

## 2017-08-27 NOTE — Anesthesia Procedure Notes (Signed)
Procedure Name: LMA Insertion Date/Time: 08/27/2017 12:31 PM Performed by: Francie MassingHazel, Rudean Icenhour D, CRNA Pre-anesthesia Checklist: Patient identified, Emergency Drugs available, Suction available and Patient being monitored Patient Re-evaluated:Patient Re-evaluated prior to induction Oxygen Delivery Method: Circle system utilized Preoxygenation: Pre-oxygenation with 100% oxygen Induction Type: IV induction Ventilation: Mask ventilation without difficulty LMA: LMA inserted LMA Size: 5.0 Number of attempts: 1 Airway Equipment and Method: Bite block Placement Confirmation: positive ETCO2 Tube secured with: Tape Dental Injury: Teeth and Oropharynx as per pre-operative assessment

## 2017-08-31 ENCOUNTER — Encounter (HOSPITAL_BASED_OUTPATIENT_CLINIC_OR_DEPARTMENT_OTHER): Payer: Self-pay | Admitting: Orthopedic Surgery

## 2017-09-01 ENCOUNTER — Encounter (HOSPITAL_BASED_OUTPATIENT_CLINIC_OR_DEPARTMENT_OTHER): Payer: Self-pay | Admitting: Orthopedic Surgery

## 2017-09-01 NOTE — Transfer of Care (Signed)
Immediate Anesthesia Transfer of Care Note  Patient: Keith CaroliDarren Templin  Procedure(s) Performed: OPEN REDUCTION INTERNAL FIXATION (ORIF) RIGHT DISTAL RADIAL FRACTURE (Right )  Patient Location: PACU  Anesthesia Type:General  Level of Consciousness: awake, alert , oriented and patient cooperative  Airway & Oxygen Therapy: Patient Spontanous Breathing and Patient connected to nasal cannula oxygen  Post-op Assessment: Report given to RN and Post -op Vital signs reviewed and stable  Post vital signs: Reviewed and stable  Last Vitals:  Vitals Value Taken Time  BP    Temp    Pulse    Resp    SpO2      Last Pain:  Vitals:   08/30/17 0936  TempSrc:   PainSc: 4       Patients Stated Pain Goal: 4 (08/27/17 1035)  Complications: No apparent anesthesia complications
